# Patient Record
Sex: Female | Born: 1960 | Race: White | Hispanic: No | Smoking: Former smoker
Health system: Southern US, Community
[De-identification: ages and names within clinical notes are randomized; demographics above are authoritative.]

## PROBLEM LIST (undated history)

## (undated) DIAGNOSIS — G4730 Sleep apnea, unspecified: Secondary | ICD-10-CM

## (undated) DIAGNOSIS — J45909 Unspecified asthma, uncomplicated: Secondary | ICD-10-CM

## (undated) HISTORY — DX: Unspecified asthma, uncomplicated: J45.909

## (undated) HISTORY — DX: Sleep apnea, unspecified: G47.30

---

## 1991-12-08 HISTORY — PX: TONSILLECTOMY: SUR1361

## 2004-12-04 ENCOUNTER — Emergency Department (HOSPITAL_COMMUNITY): Admission: EM | Admit: 2004-12-04 | Discharge: 2004-12-04 | Payer: Self-pay | Admitting: *Deleted

## 2006-07-20 IMAGING — CT CT ANGIO CHEST
1 of 3 series · 20 of 32 positions shown · IV contrast (omnipaque)
Comparison: none

CLINICAL DATA: Left sided chest pain and shortness of breath.
 CT ANGIOGRAPHY OF THE CHEST WITH CONTRAST:
 Scans were performed following intravenous injection of 150 cc Omnipaque 300.  Multiplanar reconstructions were performed in the sagittal and coronal planes from the axial data.
 The scan demonstrates that the patient does have mild cardiomegaly with some minimal linear atelectasis at the right base.  There is no pulmonary embolus, infiltrate, effusion, mass lesion, or significant abnormality.

[Series 4: chest/pe 1.0 b10f · axial · 0.70mm/px · z∈[+1147,+1332]mm · 20 of 407 slices shown]
[im 19/407  lung]
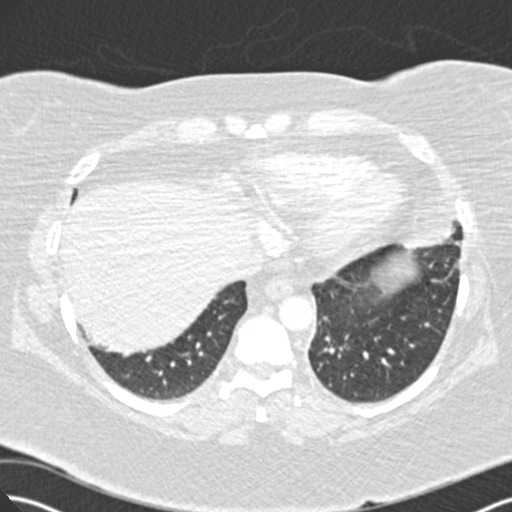
[im 37/407  mediastinal]
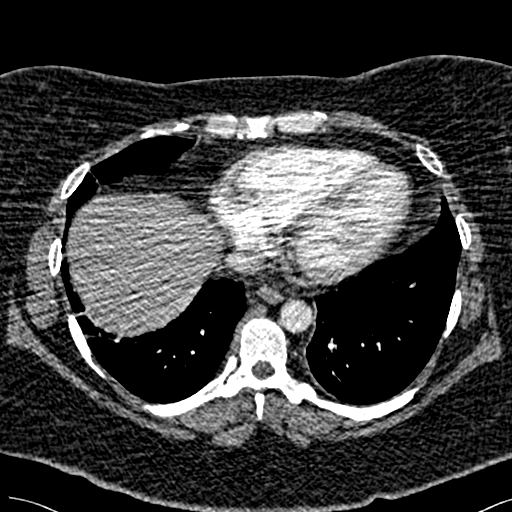
[im 74/407  lung]
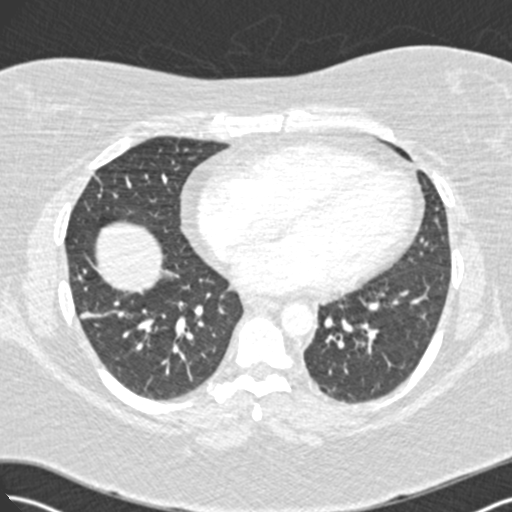
[im 85/407  mediastinal]
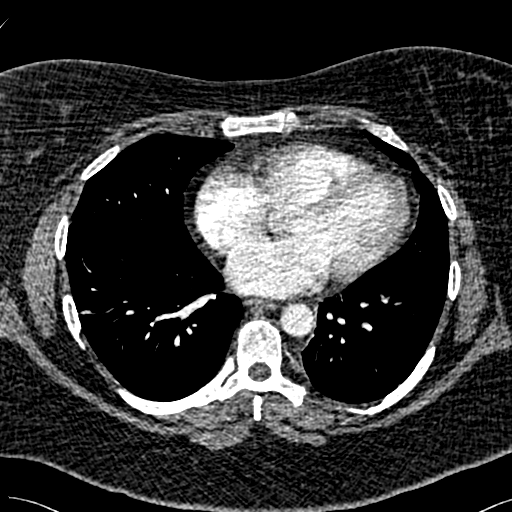
[im 93/407  lung]
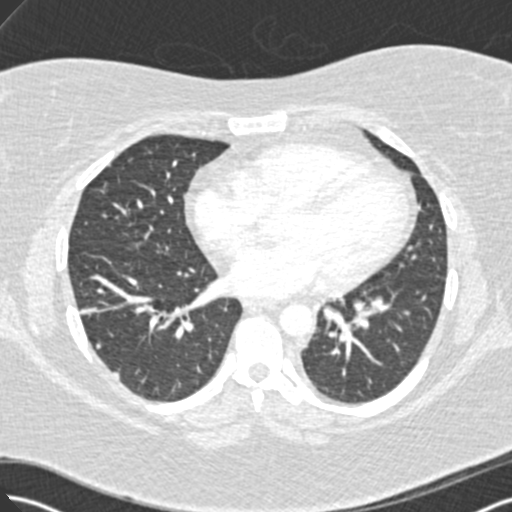
[im 111/407  mediastinal]
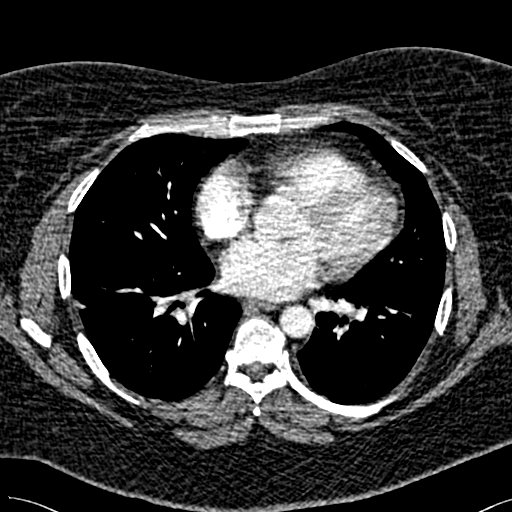
[im 130/407  lung]
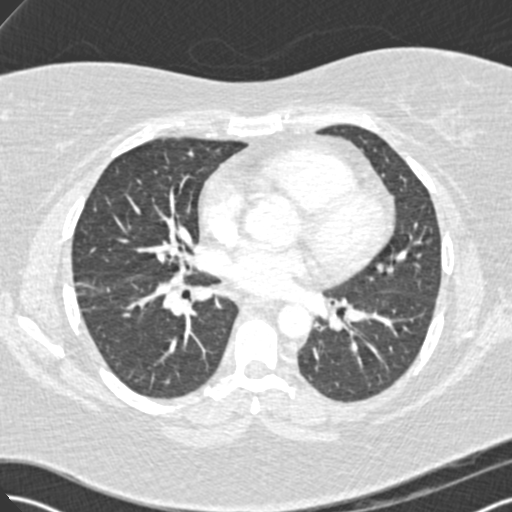
[im 148/407  mediastinal]
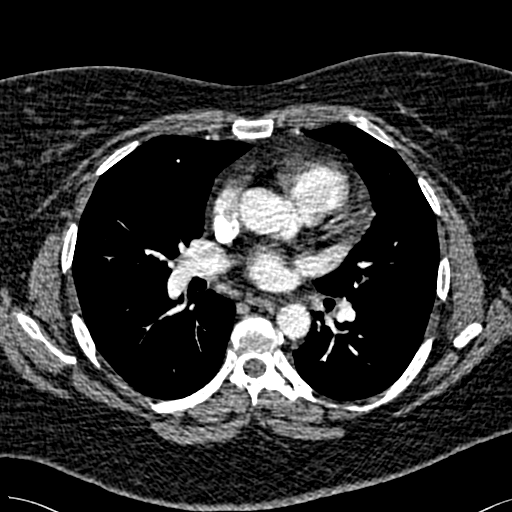
[im 167/407  lung]
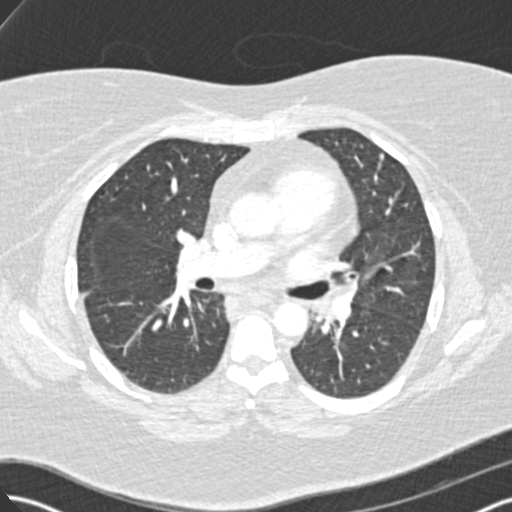
[im 185/407  mediastinal]
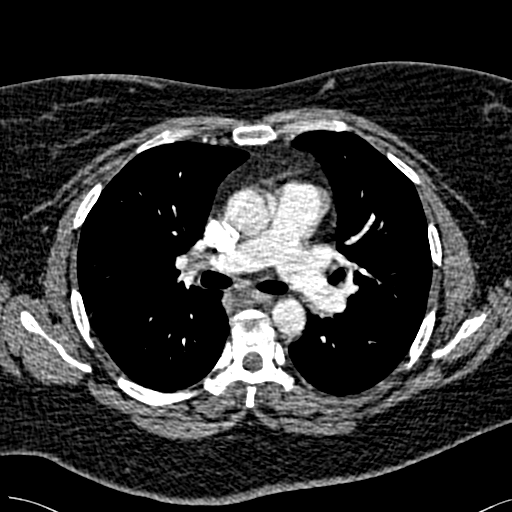
[im 204/407  lung]
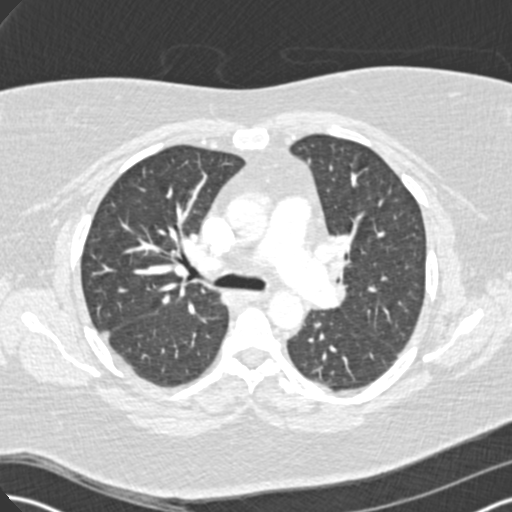
[im 222/407  mediastinal]
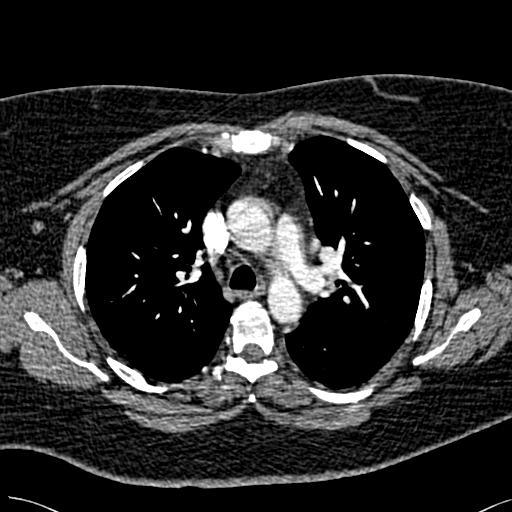
[im 259/407  lung]
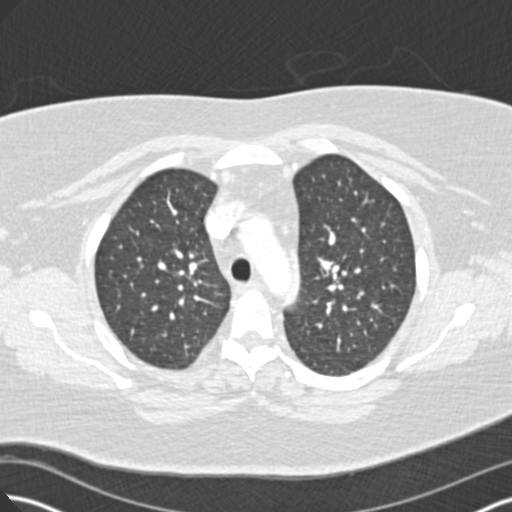
[im 271/407  mediastinal]
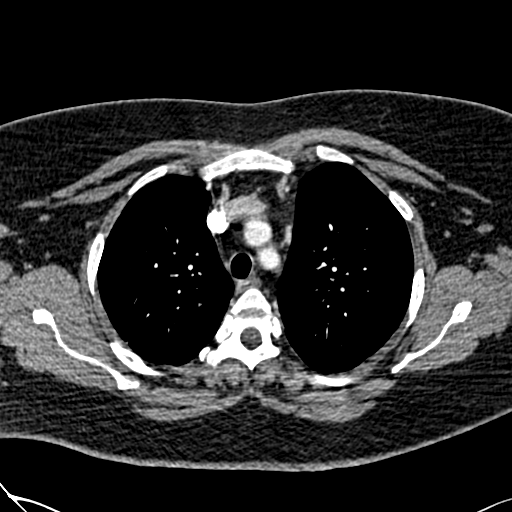
[im 277/407  lung]
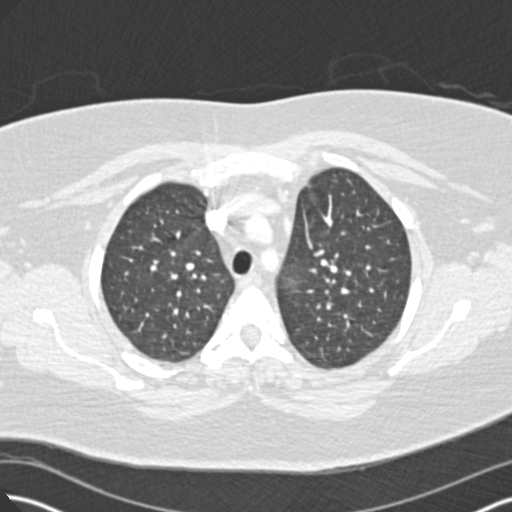
[im 296/407  mediastinal]
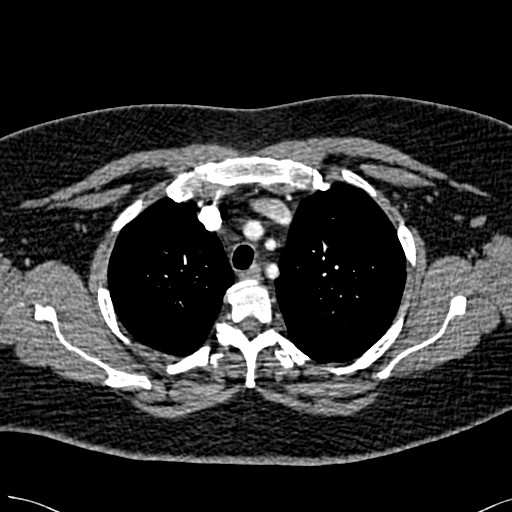
[im 314/407  lung]
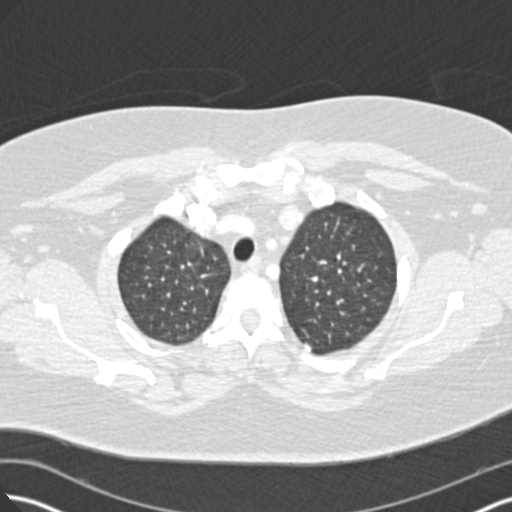
[im 351/407  mediastinal]
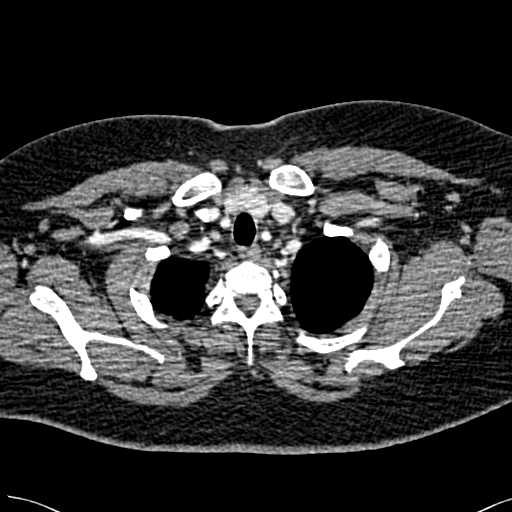
[im 370/407  lung]
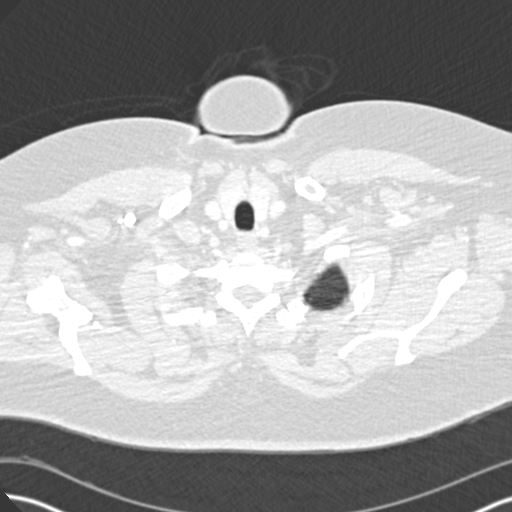
[im 388/407  mediastinal]
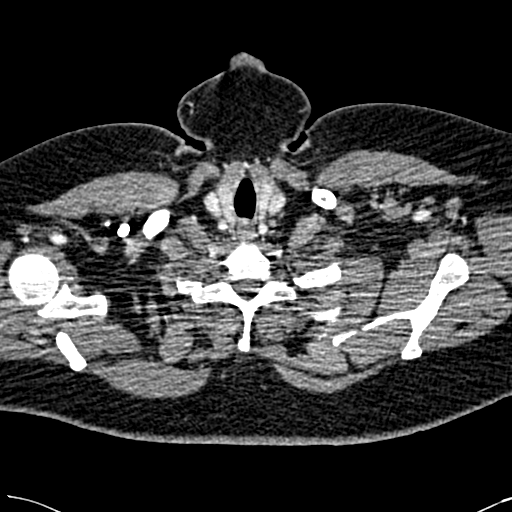

[20 of 32 positions shown; findings below may reference images not displayed]

IMPRESSION: Mild cardiomegaly.  Minimal atelectasis at the right base.

## 2007-09-21 ENCOUNTER — Ambulatory Visit (HOSPITAL_COMMUNITY): Admission: RE | Admit: 2007-09-21 | Discharge: 2007-09-21 | Payer: Self-pay | Admitting: Obstetrics and Gynecology

## 2007-09-28 ENCOUNTER — Encounter: Admission: RE | Admit: 2007-09-28 | Discharge: 2007-09-28 | Payer: Self-pay | Admitting: Obstetrics and Gynecology

## 2007-10-21 ENCOUNTER — Ambulatory Visit (HOSPITAL_COMMUNITY): Admission: RE | Admit: 2007-10-21 | Discharge: 2007-10-21 | Payer: Self-pay | Admitting: Obstetrics and Gynecology

## 2007-10-21 ENCOUNTER — Encounter (INDEPENDENT_AMBULATORY_CARE_PROVIDER_SITE_OTHER): Payer: Self-pay | Admitting: Obstetrics and Gynecology

## 2009-09-24 ENCOUNTER — Ambulatory Visit (HOSPITAL_COMMUNITY): Admission: RE | Admit: 2009-09-24 | Discharge: 2009-09-24 | Payer: Self-pay | Admitting: Obstetrics and Gynecology

## 2010-05-08 DIAGNOSIS — N958 Other specified menopausal and perimenopausal disorders: Secondary | ICD-10-CM | POA: Insufficient documentation

## 2010-05-08 DIAGNOSIS — J45909 Unspecified asthma, uncomplicated: Secondary | ICD-10-CM | POA: Insufficient documentation

## 2010-05-08 DIAGNOSIS — E669 Obesity, unspecified: Secondary | ICD-10-CM | POA: Insufficient documentation

## 2010-05-08 DIAGNOSIS — L909 Atrophic disorder of skin, unspecified: Secondary | ICD-10-CM | POA: Insufficient documentation

## 2010-05-08 DIAGNOSIS — J309 Allergic rhinitis, unspecified: Secondary | ICD-10-CM | POA: Insufficient documentation

## 2010-05-08 DIAGNOSIS — F329 Major depressive disorder, single episode, unspecified: Secondary | ICD-10-CM | POA: Insufficient documentation

## 2010-06-23 DIAGNOSIS — K5900 Constipation, unspecified: Secondary | ICD-10-CM | POA: Insufficient documentation

## 2010-06-23 DIAGNOSIS — I10 Essential (primary) hypertension: Secondary | ICD-10-CM | POA: Insufficient documentation

## 2010-06-26 DIAGNOSIS — E559 Vitamin D deficiency, unspecified: Secondary | ICD-10-CM | POA: Insufficient documentation

## 2010-09-25 ENCOUNTER — Ambulatory Visit (HOSPITAL_COMMUNITY): Admission: RE | Admit: 2010-09-25 | Discharge: 2010-09-25 | Payer: Self-pay | Admitting: Family Medicine

## 2010-12-28 ENCOUNTER — Encounter: Payer: Self-pay | Admitting: Obstetrics and Gynecology

## 2011-04-21 NOTE — Op Note (Signed)
NAME:  Amy Kramer, Amy Kramer              ACCOUNT NO.:  [id number]   MEDICAL RECORD NO.:  [id number]          PATIENT TYPE:  AMB   LOCATION:  SDC                           FACILITY:  [facility]   PHYSICIAN:  Naima A. Dillard, M.D. DATE OF BIRTH:  January 11, 1961   DATE OF PROCEDURE:  10/21/2007  DATE OF DISCHARGE:                               OPERATIVE REPORT   PREOPERATIVE DIAGNOSIS:  Metrorrhagia, uterine fibroids and questionable  endometrial mass.   POSTOPERATIVE DIAGNOSIS:  Metrorrhagia and uterine fibroids.   PROCEDURE:  D and C hysteroscopy, ThermaChoice endometrial ablation.   SURGEON:  Naima A. Dillard, M.D.   ASSISTANTS:  None.   ANESTHESIA:  General.   FINDINGS:  There are abundant fluffy endometrium and blood in the  endometrial cavity.  No submucosal fibroids or polyps when visualized.  The uterine cavity was visualized anteriorly and posteriorly and both  side walls.  Both ostia were visualized.   SPECIMENS:  Were endometrial curettings which were sent to pathology.   ESTIMATED BLOOD LOSS:  Was minimal.   COMPLICATIONS:  None.   DISPOSITION:  The patient went to the recovery room in stable condition.   PROCEDURE IN DETAIL:  The patient was taken to the operating room where  she was given general anesthesia, placed in dorsal lithotomy position  and prepped and draped in the normal sterile fashion.  A straight  catheter was used to drain the bladder.  A bivalve speculum was placed  into the vagina.  The anterior lip of the cervix was grasped with single  tooth tenaculum and hysteroscope was placed into the uterine cavity and  the findings noted above were seen.  Endometrial curettings were then  obtained and sent to pathology.  The ThermaChoice ablation was done per  protocol with pressures between 160 and 180 for a total of 8 minutes and  a cool down period occurred.  The balloon was removed.  The hysteroscope  was placed into the uterine cavity and the ablation was  successful and  ablated 99% of the uterine cavity.  I did not really see any pink areas  at all and no signs of perforation.  The deficit  did range at 360 on the monitor, however, about 200 mL of 3% sorbitol  was on the floor.  Sponge, lap and needle counts were correct.  The  patient to recovery room in stable condition.  Before the hysteroscope  was placed in the uterine cavity the uterus did sound to 8 cm and the  cervix did not need to be dilated, it was already dilated.      Naima A. Normand Sloop, M.D.  Electronically Signed     NAD/MEDQ  D:  10/21/2007  T:  10/22/2007  Job:  [id number]

## 2011-04-21 NOTE — H&P (Signed)
NAME:  Amy Kramer, Amy Kramer              ACCOUNT NO.:  [id number]   MEDICAL RECORD NO.:  [id number]          PATIENT TYPE:  AMB   LOCATION:  SDC                           FACILITY:  [facility]   PHYSICIAN:  Naima A. Dillard, M.D. DATE OF BIRTH:  Apr 17, 1961   DATE OF ADMISSION:  DATE OF DISCHARGE:                              HISTORY & PHYSICAL   CHIEF COMPLAINT:  Metrorrhagia and fibroids.   The patient is a 50 year old female who presented complaining of  irregular periods.  She missed her period for 2 months and then after  that began spotting which got heavier and then had spotting for  approximately 2 months.  The flow would be heavy at times and then light  at times and then she had bad cramping.  She also is complaining of  nausea.  She is not on contraception.  She does have a history of  fibroids.  She is not on any hormone therapy.  She does not have any  menopausal symptoms, no new medications, or vaginal discharge.  She has  some cramping and no increased stress.  The patient denies any bleeding  disorders.   PAST MEDICAL HISTORY:  As above.   FAMILY HISTORY:  Significant for diabetes in her mother and hypertension  in her mother and sister with ovarian cancer.   PAST SURGICAL HISTORY:  Significant for:  1. Tonsils and adenoids removed.  2. Bilateral tubal ligation.   PAST OBSTETRICAL HISTORY:  Significant for a vaginal delivery x1.   The patient has no known drug allergies.   The patient does not smoke tobacco.  She denies any alcohol use or  illicit drug use.   Her ultrasound was significant for uterine length of 8.93 x 5.84 x 7.41  with several fibroids, the largest being 2.4-cm, and a questionable  endometrial mass measuring 1.3 x 1.6-cm.  Her ovaries were normal.   REVIEW OF SYSTEMS:  ENDOCRINE:  No thyroid disease.  GENITOURINARY:  As  above.  RESPIRATORY:  No asthma.  GI:  No irritable bowel syndrome.  HEMATOLOGIC:  No bleeding disorder.   PHYSICAL EXAMINATION:   VITAL SIGNS:  The patient's blood pressure is  132/78.  She weighs 318 pounds.  HEENT:  Pupils are equal.  Hearing is normal.  Throat is clear.  Thyroid  is not enlarged.  HEART:  Regular rate and rhythm.  LUNGS:  Clear to auscultation bilaterally.  BREASTS:  Have no masses, discharge, skin changes, or nipple retraction  bilaterally.  BACK:  No CVA tenderness.  ABDOMEN:  Nontender without any masses or organomegaly.  EXTREMITIES:  No cyanosis, clubbing, or edema.  NEUROLOGIC:  Within normal limits.  PELVIC:  Full vaginal exam is within normal limits.  The cervix is  nontender without lesions.  Uterus is normal shape, size, and  consistency, and nontender.  Adnexa have no masses.   Urine pregnancy test was negative.  Ultrasound as above.  Pap smear was  normal.   ASSESSMENT:  Metrorrhagia.   PLAN:  The patient was given all options for metrorrhagia.  She was  given Provera which did stop  her vaginal bleeding.  She wants to proceed  with a D&C hysteroscopy with possible resection of the endometrial mass  and also evaluation of the endometrial cells.  She also wants to proceed  with endometrial ablation.  The patient understands the risks could be,  but not limited to, bleeding, infection, damage to internal organs such  as bowel, bladder, major blood vessels.      Naima A. Normand Sloop, M.D.  Electronically Signed     NAD/MEDQ  D:  10/20/2007  T:  10/20/2007  Job:  [id number]

## 2011-08-17 ENCOUNTER — Other Ambulatory Visit (HOSPITAL_COMMUNITY): Payer: Self-pay | Admitting: Obstetrics and Gynecology

## 2011-08-17 DIAGNOSIS — Z1231 Encounter for screening mammogram for malignant neoplasm of breast: Secondary | ICD-10-CM

## 2011-09-15 LAB — CBC
HCT: 36.3
Hemoglobin: 12.1
MCHC: 33.3
MCV: 76.2 — ABNORMAL LOW
Platelets: 379
RBC: 4.76
RDW: 17.1 — ABNORMAL HIGH
WBC: 9.3

## 2011-09-15 LAB — PREGNANCY, URINE: Preg Test, Ur: NEGATIVE

## 2011-10-01 ENCOUNTER — Ambulatory Visit (HOSPITAL_COMMUNITY)
Admission: RE | Admit: 2011-10-01 | Discharge: 2011-10-01 | Disposition: A | Discharge status: Home / Self Care | Payer: 59 | Source: Ambulatory Visit | Attending: Obstetrics and Gynecology | Admitting: Obstetrics and Gynecology

## 2011-10-01 DIAGNOSIS — Z1231 Encounter for screening mammogram for malignant neoplasm of breast: Secondary | ICD-10-CM

## 2012-08-25 ENCOUNTER — Other Ambulatory Visit: Payer: Self-pay | Admitting: Obstetrics and Gynecology

## 2012-08-25 DIAGNOSIS — Z1231 Encounter for screening mammogram for malignant neoplasm of breast: Secondary | ICD-10-CM

## 2012-10-03 ENCOUNTER — Ambulatory Visit (HOSPITAL_COMMUNITY)
Admission: RE | Admit: 2012-10-03 | Discharge: 2012-10-03 | Disposition: A | Discharge status: Home / Self Care | Payer: 59 | Source: Ambulatory Visit | Attending: Obstetrics and Gynecology | Admitting: Obstetrics and Gynecology

## 2012-10-03 DIAGNOSIS — Z1231 Encounter for screening mammogram for malignant neoplasm of breast: Secondary | ICD-10-CM | POA: Insufficient documentation

## 2012-10-05 ENCOUNTER — Other Ambulatory Visit: Payer: Self-pay | Admitting: Obstetrics and Gynecology

## 2012-10-05 DIAGNOSIS — R928 Other abnormal and inconclusive findings on diagnostic imaging of breast: Secondary | ICD-10-CM

## 2012-10-12 ENCOUNTER — Ambulatory Visit
Admission: RE | Admit: 2012-10-12 | Discharge: 2012-10-12 | Disposition: A | Discharge status: Home / Self Care | Payer: 59 | Source: Ambulatory Visit | Attending: Obstetrics and Gynecology | Admitting: Obstetrics and Gynecology

## 2012-10-12 DIAGNOSIS — R928 Other abnormal and inconclusive findings on diagnostic imaging of breast: Secondary | ICD-10-CM

## 2013-02-01 ENCOUNTER — Other Ambulatory Visit (HOSPITAL_COMMUNITY): Payer: Self-pay | Admitting: Cardiovascular Disease

## 2013-02-01 DIAGNOSIS — R9431 Abnormal electrocardiogram [ECG] [EKG]: Secondary | ICD-10-CM

## 2013-02-08 ENCOUNTER — Ambulatory Visit (HOSPITAL_COMMUNITY)
Admission: RE | Admit: 2013-02-08 | Discharge: 2013-02-08 | Disposition: A | Discharge status: Home / Self Care | Payer: 59 | Source: Ambulatory Visit | Attending: Cardiovascular Disease | Admitting: Cardiovascular Disease

## 2013-02-08 DIAGNOSIS — R9431 Abnormal electrocardiogram [ECG] [EKG]: Secondary | ICD-10-CM | POA: Insufficient documentation

## 2013-02-08 MED ORDER — REGADENOSON 0.4 MG/5ML IV SOLN
0.4000 mg | Freq: Once | INTRAVENOUS | Status: AC
  Administered 2013-02-08: 0.4 mg via INTRAVENOUS

## 2013-02-08 MED ORDER — TECHNETIUM TC 99M SESTAMIBI GENERIC - CARDIOLITE
30.0000 | Freq: Once | INTRAVENOUS | Status: AC | PRN
  Administered 2013-02-08: 30 via INTRAVENOUS

## 2013-02-08 NOTE — Procedures (Addendum)
[facility] [address] [place] [address] [place] [address] [phone]  Cardiology Nuclear Med Study  Amy Kramer is a 52 y.o. female     MRN : [id number]     DOB: [birth year]  Procedure Date: 02/08/2013  Nuclear Med Background Indication for Stress Test:  Evaluation for Ischemia and Abnormal EKG History:  NO PRIOR CARDIAC HISTORY Cardiac Risk Factors: Family History - CAD, History of Smoking, Lipids and Obesity  Symptoms:  DOE and Fatigue   Nuclear Pre-Procedure Caffeine/Decaff Intake:  9:00pm NPO After: 7:00am   IV Site: R Forearm  IV 0.9% NS with Angio Cath:  22g  Chest Size (in):  N/A  IV Started by: Emmit Pomfret, RN  Height: 5\' 9"  (1.753 m)  Cup Size: GG  BMI:  Body mass index is 47.53 kg/(m^2). Weight:  322 lb (146.058 kg)   Tech Comments:  N/A    Nuclear Med Study 1 or 2 day study: 2 day  Stress Test Type:  Lexiscan  Order Authorizing Provider:  MIHAI CROITORU,MD   Resting Radionuclide: Technetium 40m Sestamibi  Resting Radionuclide Dose: 31.0 mCi   Stress Radionuclide:  Technetium 24m Sestamibi  Stress Radionuclide Dose: 32.0 mCi           Stress Protocol Rest HR: 67 Stress HR: 107  Rest BP: 138/79 Stress BP: 132/64  Exercise Time (min): n/a METS: n/a   Predicted Max HR: 167 bpm % Max HR: 64.07 bpm Rate Pressure Product: 21308  Dose of Adenosine (mg):  n/a Dose of Lexiscan: 0.4 mg  Dose of Atropine (mg): n/a Dose of Dobutamine: n/a mcg/kg/min (at max HR)  Stress Test Technologist: Esperanza Sheets, CCT Nuclear Technologist: Koren Shiver, CNMT   Rest Procedure:  Myocardial perfusion imaging was performed at rest 45 minutes following the intravenous administration of Technetium 28m Sestamibi. Stress Procedure:  The patient received IV Lexiscan 0.4 mg over 15-seconds.  Technetium 18m Sestamibi injected at 30-seconds.  There were no significant changes with Lexiscan.  Quantitative spect images were obtained  after a 45 minute delay.  Transient Ischemic Dilatation (Normal <1.22):  0.96 Lung/Heart Ratio (Normal <0.45):  0.32 QGS EDV:  96 ml QGS ESV:  33 ml LV Ejection Fraction:65%  Signed by       Rest ECG: NSR - Normal EKG  Stress ECG: No significant change from baseline ECG  QPS Raw Data Images:  Normal; no motion artifact; normal heart/lung ratio. Stress Images:  Normal homogeneous uptake in all areas of the myocardium. Rest Images:  Normal homogeneous uptake in all areas of the myocardium. Subtraction (SDS):  No evidence of ischemia.  Impression Exercise Capacity:  Lexiscan with no exercise. BP Response:  Normal blood pressure response. Clinical Symptoms:  No significant symptoms noted. ECG Impression:  No significant ST segment change suggestive of ischemia. Comparison with Prior Nuclear Study: No images to compare  Overall Impression:  Normal stress nuclear study.  LV Wall Motion:  NL LV Function; NL Wall Motion   Runell Gess, MD  02/09/2013 12:46 PM

## 2013-02-09 ENCOUNTER — Ambulatory Visit (HOSPITAL_COMMUNITY)
Admission: RE | Admit: 2013-02-09 | Discharge: 2013-02-09 | Disposition: A | Discharge status: Home / Self Care | Payer: 59 | Source: Ambulatory Visit | Attending: Cardiovascular Disease | Admitting: Cardiovascular Disease

## 2013-02-09 DIAGNOSIS — R9431 Abnormal electrocardiogram [ECG] [EKG]: Secondary | ICD-10-CM | POA: Insufficient documentation

## 2013-02-09 MED ORDER — TECHNETIUM TC 99M SESTAMIBI GENERIC - CARDIOLITE
30.0000 | Freq: Once | INTRAVENOUS | Status: AC | PRN
  Administered 2013-02-09: 30 via INTRAVENOUS

## 2014-07-23 ENCOUNTER — Other Ambulatory Visit: Payer: Self-pay | Admitting: Internal Medicine

## 2014-07-23 DIAGNOSIS — Z1231 Encounter for screening mammogram for malignant neoplasm of breast: Secondary | ICD-10-CM

## 2014-08-06 ENCOUNTER — Ambulatory Visit
Admission: RE | Admit: 2014-08-06 | Discharge: 2014-08-06 | Disposition: A | Discharge status: Home / Self Care | Payer: 59 | Source: Ambulatory Visit | Attending: Internal Medicine | Admitting: Internal Medicine

## 2014-08-06 DIAGNOSIS — Z1231 Encounter for screening mammogram for malignant neoplasm of breast: Secondary | ICD-10-CM

## 2014-08-08 ENCOUNTER — Other Ambulatory Visit: Payer: Self-pay | Admitting: Internal Medicine

## 2014-08-08 DIAGNOSIS — R928 Other abnormal and inconclusive findings on diagnostic imaging of breast: Secondary | ICD-10-CM

## 2014-08-17 ENCOUNTER — Other Ambulatory Visit: Payer: 59

## 2014-08-27 ENCOUNTER — Ambulatory Visit
Admission: RE | Admit: 2014-08-27 | Discharge: 2014-08-27 | Disposition: A | Discharge status: Home / Self Care | Payer: 59 | Source: Ambulatory Visit | Attending: Internal Medicine | Admitting: Internal Medicine

## 2014-08-27 ENCOUNTER — Other Ambulatory Visit: Payer: Self-pay | Admitting: Internal Medicine

## 2014-08-27 DIAGNOSIS — R928 Other abnormal and inconclusive findings on diagnostic imaging of breast: Secondary | ICD-10-CM

## 2015-01-23 ENCOUNTER — Other Ambulatory Visit: Payer: Self-pay | Admitting: Internal Medicine

## 2015-01-23 DIAGNOSIS — N6001 Solitary cyst of right breast: Secondary | ICD-10-CM

## 2015-02-25 ENCOUNTER — Ambulatory Visit
Admission: RE | Admit: 2015-02-25 | Discharge: 2015-02-25 | Disposition: A | Discharge status: Home / Self Care | Payer: 59 | Source: Ambulatory Visit | Attending: Internal Medicine | Admitting: Internal Medicine

## 2015-02-25 DIAGNOSIS — N6001 Solitary cyst of right breast: Secondary | ICD-10-CM

## 2015-08-06 ENCOUNTER — Encounter: Payer: Self-pay | Admitting: Cardiovascular Disease

## 2015-09-12 ENCOUNTER — Other Ambulatory Visit: Payer: Self-pay | Admitting: Obstetrics and Gynecology

## 2015-09-12 DIAGNOSIS — N649 Disorder of breast, unspecified: Secondary | ICD-10-CM

## 2015-10-28 ENCOUNTER — Other Ambulatory Visit: Payer: 59

## 2016-04-07 DIAGNOSIS — E079 Disorder of thyroid, unspecified: Secondary | ICD-10-CM | POA: Insufficient documentation

## 2016-04-13 ENCOUNTER — Other Ambulatory Visit: Payer: 59

## 2016-11-27 ENCOUNTER — Other Ambulatory Visit: Payer: Self-pay | Admitting: Obstetrics and Gynecology

## 2016-11-27 DIAGNOSIS — R928 Other abnormal and inconclusive findings on diagnostic imaging of breast: Secondary | ICD-10-CM

## 2016-12-09 ENCOUNTER — Other Ambulatory Visit: Payer: 59

## 2017-02-10 ENCOUNTER — Ambulatory Visit
Admission: RE | Admit: 2017-02-10 | Discharge: 2017-02-10 | Disposition: A | Discharge status: Home / Self Care | Payer: 59 | Source: Ambulatory Visit | Attending: Obstetrics and Gynecology | Admitting: Obstetrics and Gynecology

## 2017-02-10 DIAGNOSIS — R928 Other abnormal and inconclusive findings on diagnostic imaging of breast: Secondary | ICD-10-CM

## 2018-11-02 ENCOUNTER — Telehealth: Payer: Self-pay

## 2018-11-02 NOTE — Telephone Encounter (Signed)
LM for patient to schedule a new patient apt.

## 2018-12-12 ENCOUNTER — Encounter: Payer: Self-pay | Admitting: Internal Medicine

## 2018-12-12 ENCOUNTER — Ambulatory Visit: Payer: Managed Care, Other (non HMO) | Admitting: Internal Medicine

## 2018-12-12 DIAGNOSIS — J45909 Unspecified asthma, uncomplicated: Secondary | ICD-10-CM

## 2018-12-12 DIAGNOSIS — J449 Chronic obstructive pulmonary disease, unspecified: Secondary | ICD-10-CM

## 2018-12-12 DIAGNOSIS — G4733 Obstructive sleep apnea (adult) (pediatric): Secondary | ICD-10-CM | POA: Diagnosis not present

## 2018-12-12 MED ORDER — FLUTICASONE-SALMETEROL 115-21 MCG/ACT IN AERO: 2.0000 | INHALATION_SPRAY | Freq: Two times a day (BID) | RESPIRATORY_TRACT | 12 refills | Status: DC

## 2018-12-12 MED ORDER — PREDNISONE 20 MG PO TABS: 20.0000 mg | ORAL_TABLET | Freq: Every day | ORAL | 0 refills | Status: DC

## 2018-12-12 NOTE — Progress Notes (Signed)
Name: Amy Kramer MRN: [id number] DOB: March 25, 1961     CONSULTATION DATE:  REFERRING MD :   CHIEF COMPLAINT: I have a cough  HISTORY OF PRESENT ILLNESS: 58 year old pleasant white female seen today for shortness of breath and cough She has been diagnosed with asthma as a child when she was 53 years old Patient has chronic shortness of breath and dyspnea on exertion for many years  Also has intermittent wheezing for the last several years Patient weighs approximately 350 pounds and is morbidly obese  In the last couple of weeks the patient has had progressive dry cough with increased work of breathing and shortness of breath  Several months ago patient was prescribed Breo Subsequently developed sore throat patient did not rinse her mouth out Patient was also prescribed pro-air albuterol as needed which helped a little bit The Breo actually helped her symptoms as well  In the last 10 days patient had increased cough with signs and symptoms of laryngitis Patient was prescribed doxycycline  Patient states that she feels a little bit better but her cough is still persistent  Patient has had history of sleep apnea in the past but is untreated and is noncompliant     PAST MEDICAL HISTORY :  Morbid obesity Sleep apnea Asthma  has a past surgical history that includes Tonsillectomy (1993). Prior to Admission medications   Medication Sig Start Date End Date Taking? Authorizing Provider  albuterol (PROVENTIL) (2.5 MG/3ML) 0.083% nebulizer solution Take 2.5 mg by nebulization every 6 (six) hours as needed for wheezing or shortness of breath.   Yes [provider]  benzonatate (TESSALON) 100 MG capsule Take by mouth 3 (three) times daily as needed for cough.   Yes [provider]  doxycycline (VIBRAMYCIN) 100 MG capsule Take 100 mg by mouth 2 (two) times daily. 12/09/18 12/15/18 Yes [provider]  fluticasone furoate-vilanterol (BREO ELLIPTA) 100-25  MCG/INH AEPB Inhale 1 puff into the lungs daily.   Yes [provider]  Pseudoephedrine-APAP-DM (DAYQUIL PO) Take by mouth daily as needed.   Yes [provider]   No Known Allergies  FAMILY HISTORY:  family history includes Breast cancer in her paternal aunt and sister; Cancer in her brother, father, and sister; Diabetes in her mother; Hyperlipidemia in her mother. SOCIAL HISTORY:  reports that she quit smoking about 28 years ago. She has never used smokeless tobacco. She reports current alcohol use of about 2.0 standard drinks of alcohol per week. She reports that she does not use drugs.  REVIEW OF SYSTEMS:   Constitutional: Negative for fever, chills, weight loss, malaise/fatigue and diaphoresis.  HENT: Negative for hearing loss, ear pain, nosebleeds, congestion, sore throat, neck pain, tinnitus and ear discharge.   Eyes: Negative for blurred vision, double vision, photophobia, pain, discharge and redness.  Respiratory: + Cough,-hemoptysis,-sputum production, shortness of breath, + + wheezing and-stridor.   Cardiovascular: Negative for chest pain, palpitations, orthopnea, claudication, leg swelling and PND.  Gastrointestinal: Negative for heartburn, nausea, vomiting, abdominal pain, diarrhea, constipation, blood in stool and melena.  Genitourinary: Negative for dysuria, urgency, frequency, hematuria and flank pain.  Musculoskeletal: Negative for myalgias, back pain, joint pain and falls.  Skin: Negative for itching and rash.  Neurological: Negative for dizziness, tingling, tremors, sensory change, speech change, focal weakness, seizures, loss of consciousness, weakness and headaches.  Endo/Heme/Allergies: Negative for environmental allergies and polydipsia. Does not bruise/bleed easily.  ALL OTHER ROS ARE NEGATIVE   BP (!) 142/82   Pulse 77  Ht 5\' 9"  (1.753 m)   Wt (!) 348 lb 6.4 oz (158 kg)   SpO2 94%   BMI 51.45 kg/m   Physical Examination:   GENERAL:NAD,  no fevers, chills, no weakness no fatigue HEAD: Normocephalic, atraumatic.  EYES: Pupils equal, round, reactive to light. Extraocular muscles intact. No scleral icterus.  MOUTH: Moist mucosal membrane.   EAR, NOSE, THROAT: Clear without exudates. No external lesions.  NECK: Supple. No thyromegaly. No nodules. No JVD.  PULMONARY:CTA B/L no wheezes, no crackles, no rhonchi CARDIOVASCULAR: S1 and S2. Regular rate and rhythm. No murmurs, rubs, or gallops. No edema.  GASTROINTESTINAL: Soft, nontender, nondistended. No masses. Positive bowel sounds.  MUSCULOSKELETAL: No swelling, clubbing, or edema. Range of motion full in all extremities.  NEUROLOGIC: Cranial nerves II through XII are intact. No gross focal neurological deficits.  SKIN: No ulceration, lesions, rashes, or cyanosis. Skin warm and dry. Turgor intact.  PSYCHIATRIC: Mood, affect within normal limits. The patient is awake, alert and oriented x 3. Insight, judgment intact.      ASSESSMENT / PLAN: [age] year old pleasant white female with signs and symptoms of underlying reactive airways disease with underlying asthma with probable underlying COPD in the setting of morbid obesity and deconditioned state with signs and symptoms of acute viral bronchitis  #1 underlying asthma COPD Recommend pulmonary function testing prior to next office visit We will stop Breo Will change to Advair HFA-patient advised to rinse mouth after every use Continue albuterol as needed  #2 acute viral bronchitis with laryngitis with mild asthma COPD exacerbation Recommend prednisone 20 mg daily for 10 days Continue doxycycline as prescribed   #3 cough from asthma exacerbation Tessalon Perles as needed  #4 underlying sleep apnea Will do follow-up sleep testing and assessment at next office visit  #5 obesity -recommend significant weight loss -recommend changing diet  #6 deconditioned state -Recommend increased daily activity and  exercise     Patient satisfied with Plan of action and management. All questions answered Follow-up in 3 months  Amy Kramer, M.D.  [facility]  Medical Director [facility] Medical Director [facility]

## 2018-12-12 NOTE — Patient Instructions (Signed)
STOP BREO  START ADVAIR HFA-rinse mouth out after every use  ALBUTEROL AS NEEDED  PREDNISONE 20 mg daily for 10 days Continue Doxycycline  Obtain PFT's prior to next office visit

## 2019-01-31 ENCOUNTER — Other Ambulatory Visit: Payer: Self-pay | Admitting: *Deleted

## 2019-01-31 ENCOUNTER — Telehealth: Payer: Self-pay | Admitting: Internal Medicine

## 2019-01-31 NOTE — Telephone Encounter (Signed)
Left message informing patient we would get his information to Dr. Belia Heman. Nothing further is needed at this time.

## 2019-01-31 NOTE — Telephone Encounter (Signed)
Ok thank you 

## 2019-03-14 ENCOUNTER — Ambulatory Visit: Payer: Managed Care, Other (non HMO)

## 2019-03-21 ENCOUNTER — Ambulatory Visit: Payer: Managed Care, Other (non HMO) | Admitting: Internal Medicine

## 2019-05-18 ENCOUNTER — Ambulatory Visit: Payer: Managed Care, Other (non HMO)

## 2019-05-25 ENCOUNTER — Ambulatory Visit: Payer: Managed Care, Other (non HMO) | Admitting: Internal Medicine

## 2019-06-07 ENCOUNTER — Other Ambulatory Visit: Payer: Self-pay

## 2019-06-15 ENCOUNTER — Telehealth: Payer: Self-pay

## 2019-06-15 NOTE — Telephone Encounter (Signed)
LM for patient to call for covid testing info. Test date 06/19/19 from 12:30-2:30 [facility] for PFT 06/22/19.

## 2019-06-15 NOTE — Telephone Encounter (Signed)
Spoke to patient, she is aware.

## 2019-06-19 ENCOUNTER — Encounter: Payer: Self-pay | Admitting: Oncology

## 2019-06-19 ENCOUNTER — Other Ambulatory Visit: Payer: Self-pay

## 2019-06-19 ENCOUNTER — Inpatient Hospital Stay: Payer: Managed Care, Other (non HMO) | Attending: Oncology | Admitting: Oncology

## 2019-06-19 ENCOUNTER — Encounter
Admission: RE | Admit: 2019-06-19 | Discharge: 2019-06-19 | Disposition: A | Discharge status: Home / Self Care | Payer: Managed Care, Other (non HMO) | Source: Ambulatory Visit | Attending: Internal Medicine | Admitting: Internal Medicine

## 2019-06-19 VITALS — BP 158/84 | HR 93 | Temp 98.2°F | Ht 66.0 in | Wt 365.0 lb

## 2019-06-19 DIAGNOSIS — Z809 Family history of malignant neoplasm, unspecified: Secondary | ICD-10-CM

## 2019-06-19 DIAGNOSIS — Z87891 Personal history of nicotine dependence: Secondary | ICD-10-CM | POA: Diagnosis not present

## 2019-06-19 DIAGNOSIS — Z01812 Encounter for preprocedural laboratory examination: Secondary | ICD-10-CM | POA: Insufficient documentation

## 2019-06-19 DIAGNOSIS — Z1159 Encounter for screening for other viral diseases: Secondary | ICD-10-CM | POA: Insufficient documentation

## 2019-06-19 DIAGNOSIS — Z8669 Personal history of other diseases of the nervous system and sense organs: Secondary | ICD-10-CM

## 2019-06-19 DIAGNOSIS — D751 Secondary polycythemia: Secondary | ICD-10-CM | POA: Diagnosis not present

## 2019-06-19 NOTE — Progress Notes (Signed)
Hematology/Oncology Consult note [name] [phone] [phone]   Patient Care Team: Patient, No Pcp Per as PCP - General (General Practice)  REFERRING PROVIDER: Armando GangLindley, Cheryl P, FNP  CHIEF COMPLAINTS/REASON FOR VISIT:  Evaluation of elevated hemoglobin  HISTORY OF PRESENTING ILLNESS:  Amy Kramer is a 58 y.o. female who was seen in consultation at the request of Armando GangLindley, Cheryl P, FNP for evaluation of elevated hemoglobin Reviewed patient's labs which were done in February 2020 and June 2020. Labs were done at [facility] and is scanned into epic's.   Labs showed  [facility] 05/25/2019.  Hb 16, hct 48.2, wbc 9, plt 254 TSH 4.15 Vitamin D 13.7 01/31/2019 hb 15.9, hct 48.6    Reviewed patient's previous labs.  Patient has blood work done remotely within current EMR.  CBC 10/20/2007 shows hemoglobin 12.1. No aggravating or elevated factors. Associated symptoms or signs: Denies. Denies weight loss, fever, chills, fatigue, night sweats.  Context:  Smoking history: Patient quit smoking in 1992. Family history of polycythemia.  Denies History of DVT: Denies  Patient reports history of sleep apnea in the past.  She had used CPAP before. She was seen by pulmonologist Dr. Belia HemanKasa in February 2020 for evaluation of shortness of breath. Dr. Jeralene HuffKosse plans to do PFT.  Felt the patient may also have underlying COPD. Per patient, after PFT is done, Dr. Belia HemanKasa  has plan for sleep study as well. Patient reports feeling not refreshed when waking up from sleep in the morning. Chronic shortness of breath, exacerbated with exertion.  Review of Systems  Constitutional: Positive for fatigue. Negative for appetite change, chills and fever.  HENT:   Negative for hearing loss and voice change.   Eyes: Negative for eye problems.  Respiratory: Positive for shortness of breath. Negative for chest tightness and cough.   Cardiovascular: Negative for chest pain.   Gastrointestinal: Negative for abdominal distention, abdominal pain and blood in stool.  Endocrine: Negative for hot flashes.  Genitourinary: Negative for difficulty urinating and frequency.   Musculoskeletal: Negative for arthralgias.  Skin: Negative for itching and rash.  Neurological: Negative for extremity weakness.  Hematological: Negative for adenopathy.  Psychiatric/Behavioral: Negative for confusion.    MEDICAL HISTORY:  Past Medical History:  Diagnosis Date  . Asthma   . Sleep apnea     SURGICAL HISTORY: Past Surgical History:  Procedure Laterality Date  . TONSILLECTOMY  1993    SOCIAL HISTORY: Social History   Socioeconomic History  . Marital status: Divorced    Spouse name: Not on file  . Number of children: Not on file  . Years of education: Not on file  . Highest education level: Not on file  Occupational History  . Not on file  Social Needs  . Financial resource strain: Not on file  . Food insecurity    Worry: Not on file    Inability: Not on file  . Transportation needs    Medical: Not on file    Non-medical: Not on file  Tobacco Use  . Smoking status: Former Smoker    Quit date: 12/07/1990    Years since quitting: 28.5  . Smokeless tobacco: Never Used  Substance and Sexual Activity  . Alcohol use: Yes    Alcohol/week: 2.0 standard drinks    Types: 2 Glasses of wine per week  . Drug use: No  . Sexual activity: Not on file  Lifestyle  . Physical activity    Days per week: Not on  file    Minutes per session: Not on file  . Stress: Not on file  Relationships  . Social Herbalist on phone: Not on file    Gets together: Not on file    Attends religious service: Not on file    Active member of club or organization: Not on file    Attends meetings of clubs or organizations: Not on file    Relationship status: Not on file  . Intimate partner violence    Fear of current or ex partner: Not on file    Emotionally abused: Not on file     Physically abused: Not on file    Forced sexual activity: Not on file  Other Topics Concern  . Not on file  Social History Narrative  . Not on file    FAMILY HISTORY: Family History  Problem Relation Age of Onset  . Diabetes Mother   . Hyperlipidemia Mother   . Cancer Father   . Cancer Brother        Colon  . Breast cancer Sister   . Cancer Sister        Breast  . Breast cancer Paternal Aunt     ALLERGIES:  has No Known Allergies.  MEDICATIONS:  Current Outpatient Medications  Medication Sig Dispense Refill  . Acetaminophen 500 MG coapsule Take 1 capsule by mouth as needed.    Marland Kitchen albuterol (PROVENTIL) (2.5 MG/3ML) 0.083% nebulizer solution Take 2.5 mg by nebulization every 6 (six) hours as needed for wheezing or shortness of breath.    . ARIPiprazole (ABILIFY) 10 MG tablet Take 1 tablet by mouth 1 day or 1 dose.    Marland Kitchen atorvastatin (LIPITOR) 40 MG tablet Take 1 tablet by mouth 1 day or 1 dose.    Marland Kitchen buPROPion (WELLBUTRIN XL) 300 MG 24 hr tablet 1 tablet 1 day or 1 dose.    . Cholecalciferol 1.25 MG (50000 UT) TABS Take 1 tablet by mouth 1 day or 1 dose.    . clonazePAM (KLONOPIN) 0.5 MG tablet Take 1 tablet by mouth 1 day or 1 dose.    . escitalopram (LEXAPRO) 20 MG tablet Take 1 tablet by mouth 1 day or 1 dose.    . meloxicam (MOBIC) 7.5 MG tablet Take 1 tablet by mouth 1 day or 1 dose.    . TRELEGY ELLIPTA 100-62.5-25 MCG/INH AEPB Inhale 1 puff into the lungs daily.    Marland Kitchen triamterene-hydrochlorothiazide (MAXZIDE-25) 37.5-25 MG tablet Take 1 tablet by mouth 1 day or 1 dose.    . albuterol (PROVENTIL) (2.5 MG/3ML) 0.083% nebulizer solution Inhale 2.5 mg into the lungs 1 day or 1 dose.    . fluticasone (FLONASE) 50 MCG/ACT nasal spray Place 2 sprays into both nostrils 1 day or 1 dose.    . metFORMIN (GLUCOPHAGE-XR) 500 MG 24 hr tablet Take 1,000 mg by mouth daily.    Marland Kitchen VICTOZA 18 MG/3ML SOPN 0.6 mg.     No current facility-administered medications for this visit.       PHYSICAL EXAMINATION: ECOG PERFORMANCE STATUS: 1 - Symptomatic but completely ambulatory Vitals:   06/19/19 0912  BP: (!) 158/84  Pulse: 93  Temp: 98.2 F (36.8 C)   Filed Weights   06/19/19 0912  Weight: (!) 365 lb (165.6 kg)    Physical Exam Constitutional:      General: She is not in acute distress.    Appearance: She is obese.  HENT:     Head: Normocephalic  and atraumatic.  Eyes:     General: No scleral icterus.    Pupils: Pupils are equal, round, and reactive to light.  Neck:     Musculoskeletal: Normal range of motion and neck supple.  Cardiovascular:     Rate and Rhythm: Normal rate and regular rhythm.     Heart sounds: Normal heart sounds.  Pulmonary:     Effort: Pulmonary effort is normal. No respiratory distress.     Breath sounds: No wheezing.  Abdominal:     General: Bowel sounds are normal. There is no distension.     Palpations: Abdomen is soft. There is no mass.     Tenderness: There is no abdominal tenderness.  Musculoskeletal: Normal range of motion.        General: No deformity.  Skin:    General: Skin is warm and dry.     Findings: No erythema or rash.  Neurological:     Mental Status: She is alert and oriented to person, place, and time.     Cranial Nerves: No cranial nerve deficit.     Coordination: Coordination normal.  Psychiatric:        Behavior: Behavior normal.        Thought Content: Thought content normal.      LABORATORY DATA:  I have reviewed the data as listed Lab Results  Component Value Date   WBC  10/20/2007    9.3 **Please note change in CBC/Diff reference range**   HGB 12.1 10/20/2007   HCT 36.3 10/20/2007   MCV 76.2 (L) 10/20/2007   PLT 379 10/20/2007   No results for input(s): NA, K, CL, CO2, GLUCOSE, BUN, CREATININE, CALCIUM, GFRNONAA, GFRAA, PROT, ALBUMIN, AST, ALT, ALKPHOS, BILITOT, BILIDIR, IBILI in the last 8760 hours. Iron/TIBC/Ferritin/ %Sat No results found for: IRON, TIBC, FERRITIN, IRONPCTSAT     RADIOGRAPHIC STUDIES: I have personally reviewed the radiological images as listed and agreed with the findings in the report. No results found.    ASSESSMENT & PLAN:  1. Erythrocytosis   2. History of sleep apnea   3. Family history of cancer    I discussed with patient that erythrocytosis can be primary versus secondary. Clinically, she has a history of sleep apnea and also has obesity, high risk for obstructive sleep apnea which may have lead to secondary erythrocytosis. For work-up, recommend checking CBC, erythropoietin level, carbon monoxide level, Jak2 [id number]  mutation with reflex to MPL, CAL R and Jak 2 exon 12-15  Patient works at [employer] and request labs to be done at [facility] [employer].  Lab requisition was provided to patient.  Family history of cancer, including breast cancer, colon cancer.  Recommend patient to have annual mammogram. Recommend genetic testing.  Will discuss further at next visit. All questions were answered. The patient knows to call the clinic with any problems questions or concerns.  Cc Armando GangLindley, Cheryl P, FNP  Return of visit: 2-3  weeks for discussion of blood work. Thank you for this kind referral and the opportunity to participate in the care of this patient. A copy of today's note is routed to referring provider  Total face to face encounter time for this patient visit was 45 min. >50% of the time was  spent in counseling and coordination of care.    Rickard PatienceZhou Deairra Halleck, MD, PhD 06/19/2019

## 2019-06-20 ENCOUNTER — Encounter: Payer: Self-pay | Admitting: Oncology

## 2019-06-20 LAB — SARS CORONAVIRUS 2 (TAT 6-24 HRS): SARS Coronavirus 2: NEGATIVE

## 2019-06-21 ENCOUNTER — Encounter: Payer: Self-pay | Admitting: Oncology

## 2019-06-22 ENCOUNTER — Ambulatory Visit (HOSPITAL_COMMUNITY): Payer: Managed Care, Other (non HMO)

## 2019-06-22 ENCOUNTER — Other Ambulatory Visit: Payer: Self-pay

## 2019-06-22 DIAGNOSIS — J45909 Unspecified asthma, uncomplicated: Secondary | ICD-10-CM | POA: Diagnosis not present

## 2019-06-22 DIAGNOSIS — J449 Chronic obstructive pulmonary disease, unspecified: Secondary | ICD-10-CM | POA: Diagnosis not present

## 2019-06-22 DIAGNOSIS — Z01812 Encounter for preprocedural laboratory examination: Secondary | ICD-10-CM | POA: Diagnosis not present

## 2019-06-26 ENCOUNTER — Encounter: Payer: Self-pay | Admitting: Oncology

## 2019-06-27 ENCOUNTER — Ambulatory Visit: Payer: Managed Care, Other (non HMO) | Admitting: Internal Medicine

## 2019-06-27 ENCOUNTER — Encounter: Admission: RE | Admit: 2019-06-27 | Payer: Managed Care, Other (non HMO) | Source: Ambulatory Visit

## 2019-06-28 ENCOUNTER — Encounter: Payer: Self-pay | Admitting: Oncology

## 2019-06-30 ENCOUNTER — Encounter: Payer: Self-pay | Admitting: Oncology

## 2019-07-03 ENCOUNTER — Encounter: Payer: Self-pay | Admitting: Oncology

## 2019-07-04 ENCOUNTER — Encounter
Admission: RE | Admit: 2019-07-04 | Discharge: 2019-07-04 | Disposition: A | Discharge status: Home / Self Care | Payer: Managed Care, Other (non HMO) | Source: Ambulatory Visit | Attending: Internal Medicine | Admitting: Internal Medicine

## 2019-07-05 ENCOUNTER — Ambulatory Visit: Payer: Managed Care, Other (non HMO) | Admitting: Internal Medicine

## 2019-07-07 ENCOUNTER — Ambulatory Visit: Admit: 2019-07-07 | Payer: Managed Care, Other (non HMO) | Admitting: Internal Medicine

## 2019-07-07 SURGERY — COLONOSCOPY WITH PROPOFOL
Anesthesia: General

## 2019-07-11 ENCOUNTER — Ambulatory Visit: Payer: Managed Care, Other (non HMO) | Admitting: Oncology

## 2024-05-04 ENCOUNTER — Ambulatory Visit: Admitting: Family Medicine

## 2024-08-10 ENCOUNTER — Ambulatory Visit: Admitting: Family Medicine

## 2024-08-11 ENCOUNTER — Ambulatory Visit: Admitting: Family Medicine

## 2024-09-06 ENCOUNTER — Ambulatory Visit: Admitting: Family Medicine

## 2024-09-28 ENCOUNTER — Ambulatory Visit: Admitting: Family Medicine

## 2024-10-31 ENCOUNTER — Ambulatory Visit: Admitting: Family Medicine

## 2024-12-08 ENCOUNTER — Ambulatory Visit: Admitting: Family Medicine

## 2025-01-23 ENCOUNTER — Ambulatory Visit: Admitting: Family Medicine

## 2025-03-20 ENCOUNTER — Ambulatory Visit: Admitting: Family Medicine

## 2025-05-23 ENCOUNTER — Ambulatory Visit: Admitting: Family Medicine
